# Patient Record
Sex: Female | Born: 1950 | Race: Black or African American | Hispanic: No | Marital: Single
Health system: Southern US, Community
[De-identification: ages and names within clinical notes are randomized; demographics above are authoritative.]

---

## 2005-04-08 ENCOUNTER — Emergency Department: Payer: Self-pay | Admitting: Emergency Medicine

## 2005-08-18 ENCOUNTER — Ambulatory Visit: Payer: Self-pay | Admitting: Internal Medicine

## 2007-08-15 ENCOUNTER — Ambulatory Visit: Payer: Self-pay | Admitting: Internal Medicine

## 2008-08-21 ENCOUNTER — Ambulatory Visit: Payer: Self-pay | Admitting: Internal Medicine

## 2008-12-30 ENCOUNTER — Ambulatory Visit: Payer: Self-pay | Admitting: General Surgery

## 2009-12-07 ENCOUNTER — Encounter: Admission: RE | Admit: 2009-12-07 | Discharge: 2009-12-07 | Payer: Self-pay | Admitting: Chiropractic Medicine

## 2010-04-12 ENCOUNTER — Ambulatory Visit: Payer: Self-pay | Admitting: Internal Medicine

## 2011-05-27 ENCOUNTER — Ambulatory Visit: Payer: Self-pay | Admitting: Internal Medicine

## 2012-05-29 ENCOUNTER — Ambulatory Visit: Payer: Self-pay | Admitting: Internal Medicine

## 2012-11-15 ENCOUNTER — Ambulatory Visit: Payer: Self-pay | Admitting: Unknown Physician Specialty

## 2013-06-21 ENCOUNTER — Ambulatory Visit: Payer: Self-pay | Admitting: Internal Medicine

## 2013-08-02 ENCOUNTER — Ambulatory Visit: Payer: Self-pay | Admitting: Internal Medicine

## 2015-06-18 IMAGING — MG MM ADDITIONAL VIEWS AT NO CHARGE
3 series · 6 of 6 positions shown · non-contrast
Comparison: Priors dating back to 08/18/2005.

CLINICAL DATA: Patient was recalled from screening for possible
right breast mass.

EXAM:
DIGITAL DIAGNOSTIC  RIGHT MAMMOGRAM WITH CAD
ULTRASOUND RIGHT BREAST

[R CC · right · 3 of 3 slices shown]
[im 1/3]
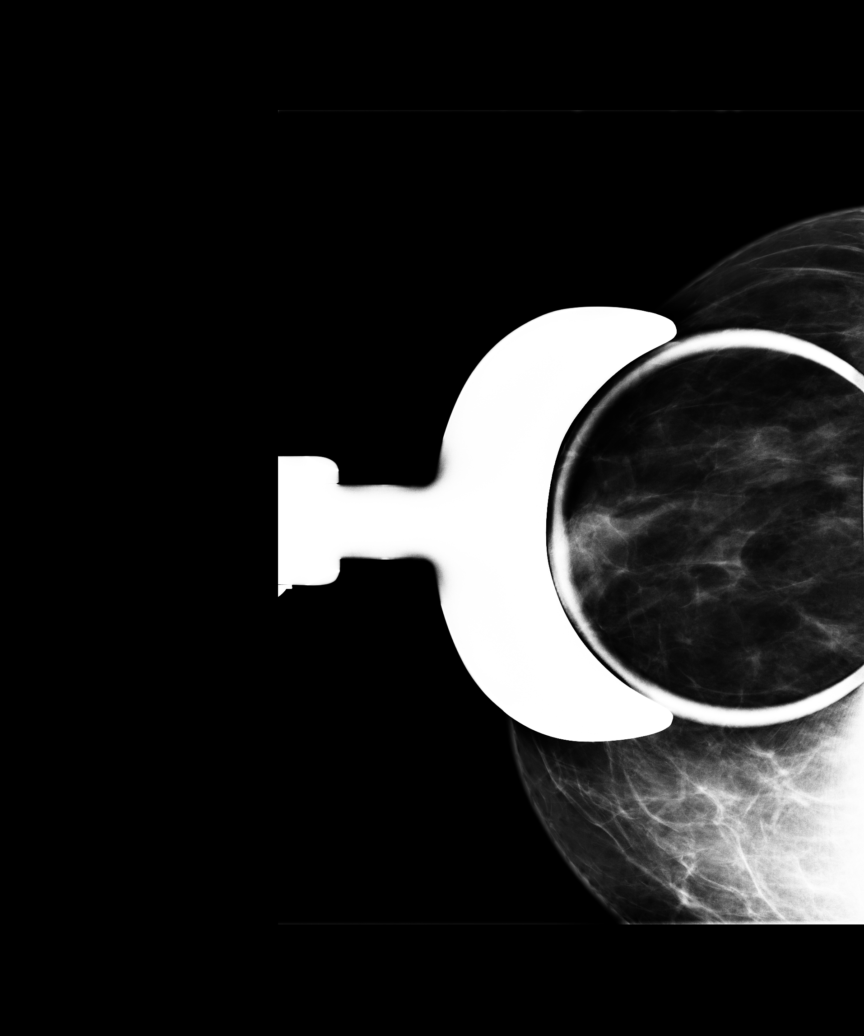
[im 2/3]
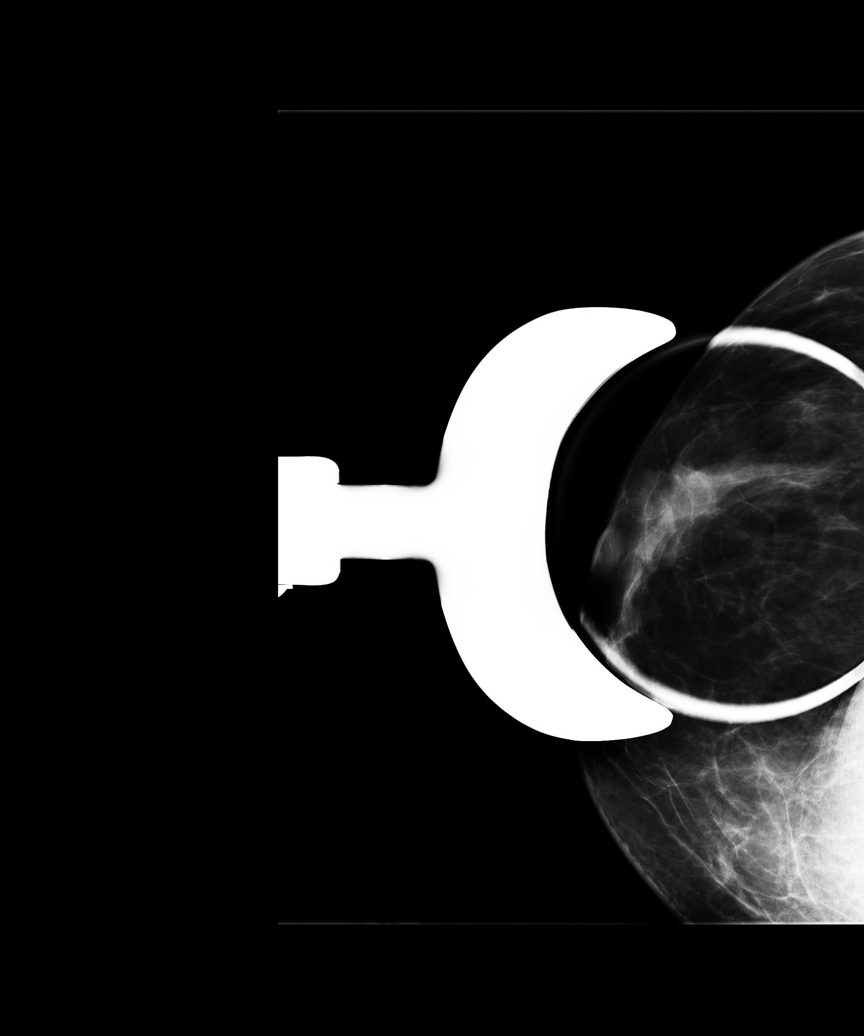
[im 3/3]
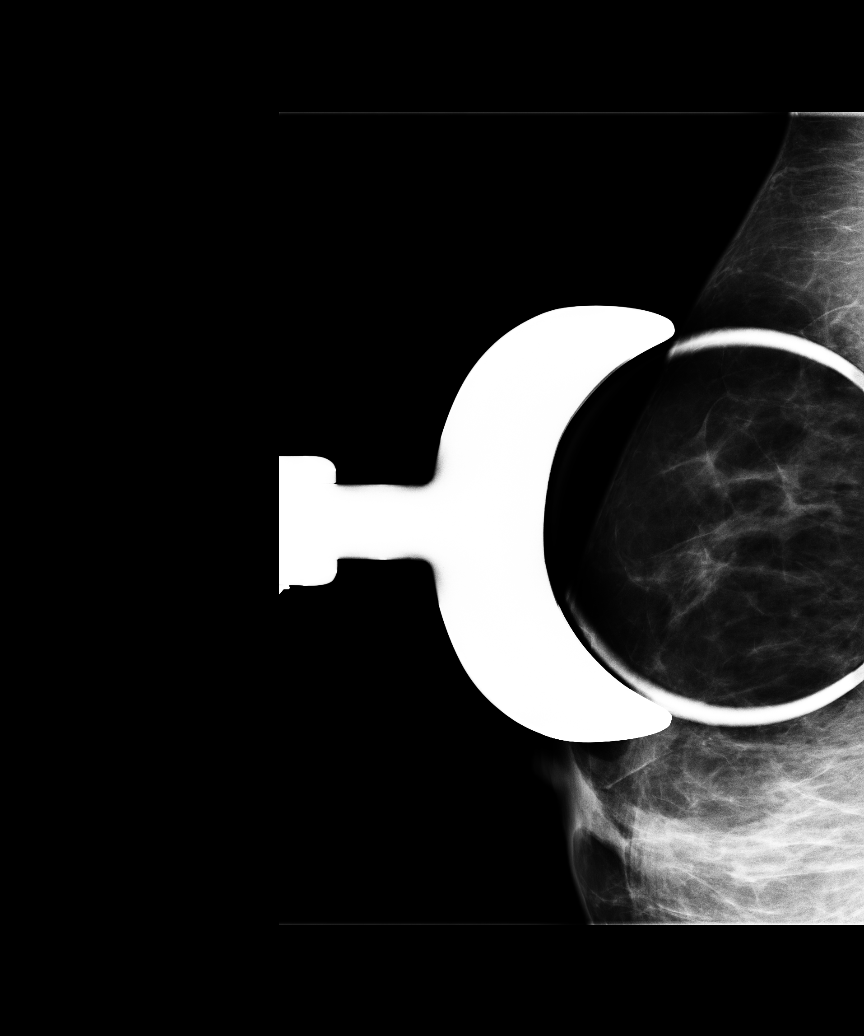

[R MLO · right · 2 of 2 slices shown (1 of 2)]
[im 1/2]
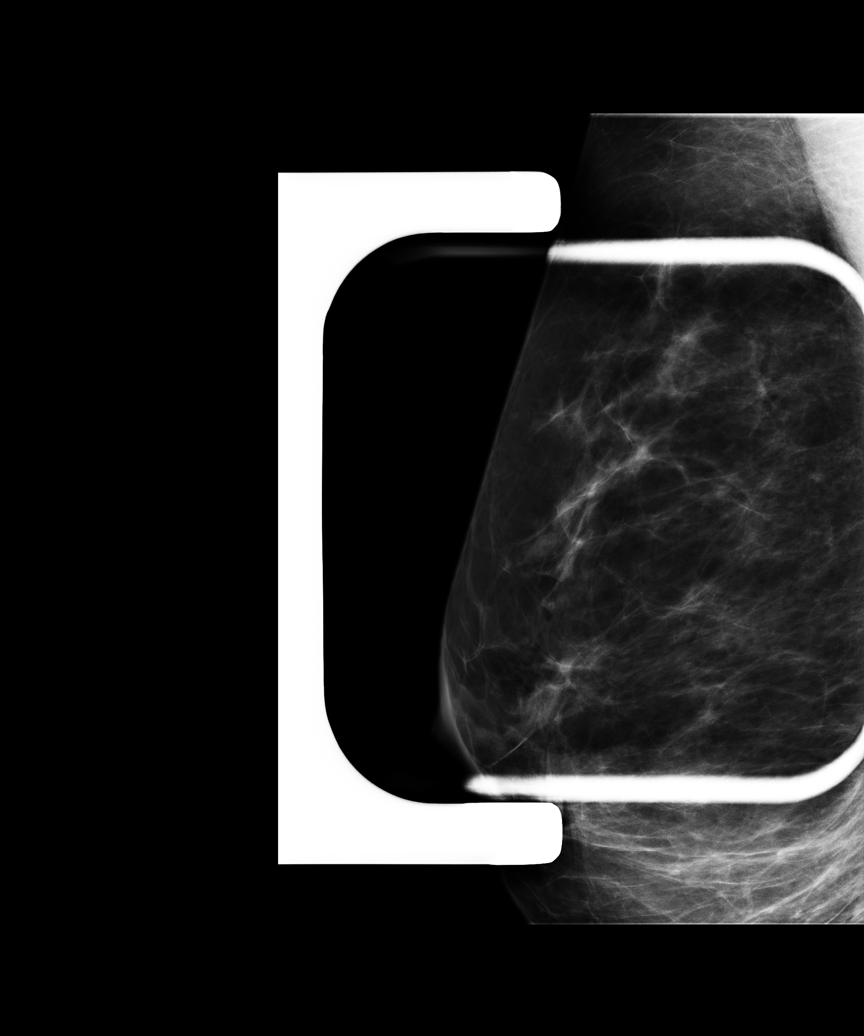
[im 2/2]
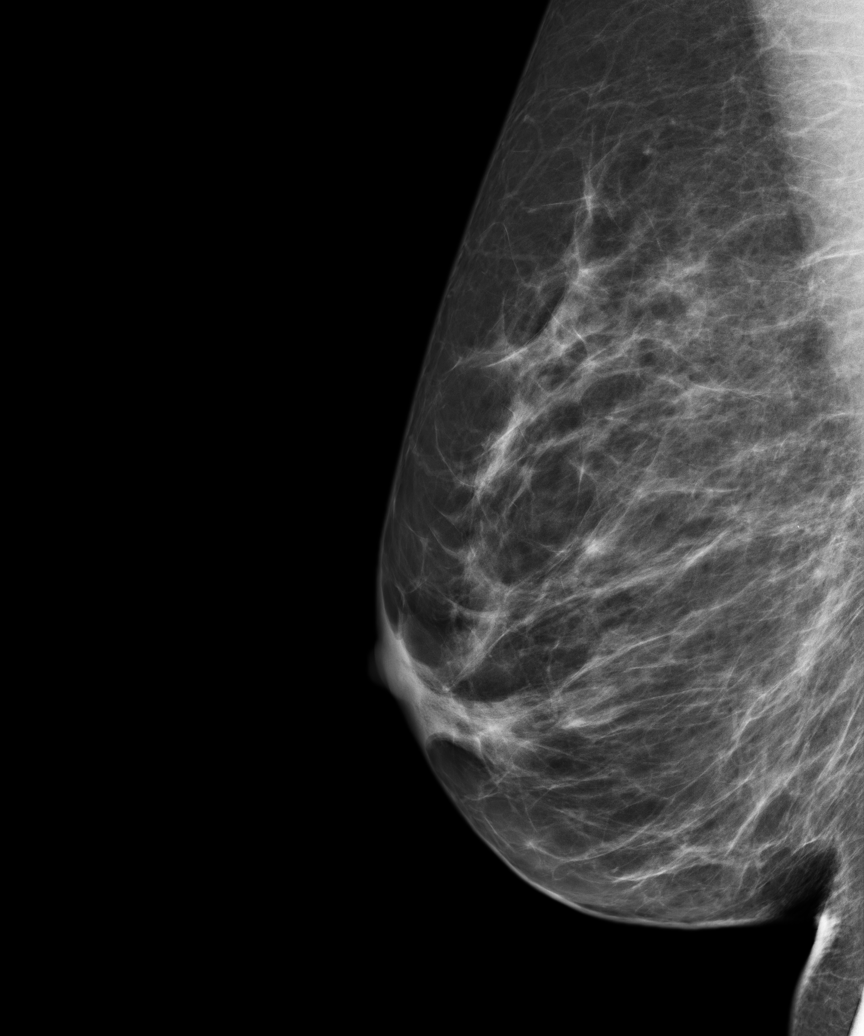

[R MLO (2 of 2)]
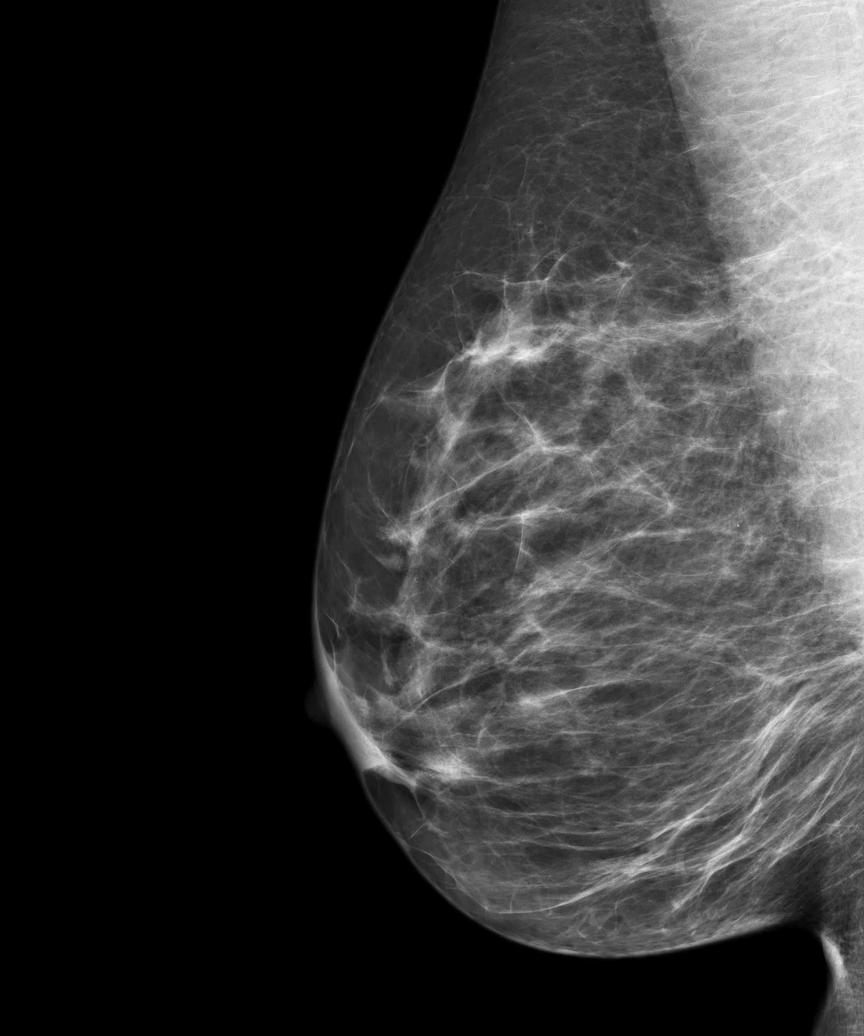

[6 of 6 positions shown; findings below may reference images not displayed]

ACR Breast Density Category b: There are scattered areas of
fibroglandular density.
FINDINGS: Questioned mass within the upper-outer right breast anterior depth
resolved with additional spot compression views, compatible with
overlapping fibroglandular breast tissue.

Mammographic images were processed with CAD.

On physical exam, I palpate no discrete mass within the upper-outer
right breast.

Ultrasound is performed, showing no concerning mass within the
upper-outer right breast.
IMPRESSION: No mammographic evidence for malignancy

RECOMMENDATION:
Screening mammogram in one year.(Code:[id number])

I have discussed the findings and recommendations with the patient.
Results were also provided in writing at the conclusion of the
visit. If applicable, a reminder letter will be sent to the patient
regarding the next appointment.

BI-RADS CATEGORY  1: Negative.

## 2015-08-24 ENCOUNTER — Other Ambulatory Visit: Payer: Self-pay | Admitting: Internal Medicine

## 2015-08-24 DIAGNOSIS — Z1231 Encounter for screening mammogram for malignant neoplasm of breast: Secondary | ICD-10-CM

## 2015-08-31 ENCOUNTER — Ambulatory Visit
Admission: RE | Admit: 2015-08-31 | Discharge: 2015-08-31 | Disposition: A | Discharge status: Home / Self Care | Payer: BLUE CROSS/BLUE SHIELD | Source: Ambulatory Visit | Attending: Internal Medicine | Admitting: Internal Medicine

## 2015-08-31 DIAGNOSIS — Z1231 Encounter for screening mammogram for malignant neoplasm of breast: Secondary | ICD-10-CM | POA: Insufficient documentation

## 2016-08-29 ENCOUNTER — Other Ambulatory Visit: Payer: Self-pay | Admitting: Internal Medicine

## 2016-08-29 DIAGNOSIS — Z1231 Encounter for screening mammogram for malignant neoplasm of breast: Secondary | ICD-10-CM

## 2016-09-29 ENCOUNTER — Ambulatory Visit: Payer: BLUE CROSS/BLUE SHIELD

## 2016-10-12 ENCOUNTER — Ambulatory Visit
Admission: RE | Admit: 2016-10-12 | Discharge: 2016-10-12 | Disposition: A | Discharge status: Home / Self Care | Payer: Medicare Other | Source: Ambulatory Visit | Attending: Internal Medicine | Admitting: Internal Medicine

## 2016-10-12 DIAGNOSIS — Z1231 Encounter for screening mammogram for malignant neoplasm of breast: Secondary | ICD-10-CM | POA: Insufficient documentation

## 2016-10-14 ENCOUNTER — Other Ambulatory Visit: Payer: Self-pay | Admitting: Internal Medicine

## 2016-10-14 DIAGNOSIS — R928 Other abnormal and inconclusive findings on diagnostic imaging of breast: Secondary | ICD-10-CM

## 2016-10-14 DIAGNOSIS — N6489 Other specified disorders of breast: Secondary | ICD-10-CM

## 2016-10-21 ENCOUNTER — Ambulatory Visit
Admission: RE | Admit: 2016-10-21 | Discharge: 2016-10-21 | Disposition: A | Discharge status: Home / Self Care | Payer: Medicare Other | Source: Ambulatory Visit | Attending: Internal Medicine | Admitting: Internal Medicine

## 2016-10-21 DIAGNOSIS — N6489 Other specified disorders of breast: Secondary | ICD-10-CM

## 2016-10-21 DIAGNOSIS — R928 Other abnormal and inconclusive findings on diagnostic imaging of breast: Secondary | ICD-10-CM

## 2016-10-28 ENCOUNTER — Other Ambulatory Visit: Payer: BLUE CROSS/BLUE SHIELD

## 2016-10-28 ENCOUNTER — Ambulatory Visit: Payer: BLUE CROSS/BLUE SHIELD

## 2019-09-09 ENCOUNTER — Ambulatory Visit: Payer: Medicare Other | Attending: Internal Medicine

## 2019-09-09 DIAGNOSIS — Z23 Encounter for immunization: Secondary | ICD-10-CM

## 2019-09-09 NOTE — Progress Notes (Signed)
   Covid-19 Vaccination Clinic  Name:  Avigail Pilling    MRN: [id number] DOB: 07-28-1950  09/09/2019  Ms. Pizzuto was observed post Covid-19 immunization for 15 minutes without incident. She was provided with Vaccine Information Sheet and instruction to access the V-Safe system.   Ms. Glandon was instructed to call 911 with any severe reactions post vaccine: Marland Kitchen Difficulty breathing  . Swelling of face and throat  . A fast heartbeat  . A bad rash all over body  . Dizziness and weakness   Immunizations Administered    Name Date Dose VIS Date Route   Pfizer COVID-19 Vaccine 09/09/2019  9:34 AM 0.3 mL 05/24/2019 Intramuscular   Manufacturer: [employer], [employer]   Lot: [id number]   NDC: [address]-3

## 2019-10-02 ENCOUNTER — Ambulatory Visit: Payer: BLUE CROSS/BLUE SHIELD

## 2019-10-08 ENCOUNTER — Ambulatory Visit: Payer: Medicare Other | Attending: Internal Medicine

## 2019-10-08 DIAGNOSIS — Z23 Encounter for immunization: Secondary | ICD-10-CM

## 2019-10-08 NOTE — Progress Notes (Signed)
   Covid-19 Vaccination Clinic  Name:  Avion Patella    MRN: [id number] DOB: May 21, 1951  10/08/2019  Ms. Pigue was observed post Covid-19 immunization for 15 minutes without incident. She was provided with Vaccine Information Sheet and instruction to access the V-Safe system.   Ms. Pescador was instructed to call 911 with any severe reactions post vaccine: Marland Kitchen Difficulty breathing  . Swelling of face and throat  . A fast heartbeat  . A bad rash all over body  . Dizziness and weakness   Immunizations Administered    Name Date Dose VIS Date Route   Pfizer COVID-19 Vaccine 10/08/2019 11:50 AM 0.3 mL 08/07/2018 Intramuscular   Manufacturer: [employer], [employer]   Lot: [id number]   NDC: [address]-7
